# Patient Record
Sex: Female | Born: 1950 | Race: White | Hispanic: No | Smoking: Current every day smoker
Health system: Southern US, Community
[De-identification: ages and names within clinical notes are randomized; demographics above are authoritative.]

## PROBLEM LIST (undated history)

## (undated) DIAGNOSIS — J189 Pneumonia, unspecified organism: Secondary | ICD-10-CM

## (undated) DIAGNOSIS — J40 Bronchitis, not specified as acute or chronic: Secondary | ICD-10-CM

---

## 2000-10-03 ENCOUNTER — Emergency Department (HOSPITAL_COMMUNITY): Admission: EM | Admit: 2000-10-03 | Discharge: 2000-10-04 | Payer: Self-pay | Admitting: Emergency Medicine

## 2000-10-04 ENCOUNTER — Encounter: Payer: Self-pay | Admitting: Emergency Medicine

## 2002-07-21 ENCOUNTER — Emergency Department (HOSPITAL_COMMUNITY): Admission: EM | Admit: 2002-07-21 | Discharge: 2002-07-22 | Payer: Self-pay | Admitting: Emergency Medicine

## 2002-07-22 ENCOUNTER — Encounter: Payer: Self-pay | Admitting: Emergency Medicine

## 2002-11-06 ENCOUNTER — Emergency Department (HOSPITAL_COMMUNITY): Admission: EM | Admit: 2002-11-06 | Discharge: 2002-11-06 | Payer: Self-pay | Admitting: Emergency Medicine

## 2003-02-22 ENCOUNTER — Emergency Department (HOSPITAL_COMMUNITY): Admission: EM | Admit: 2003-02-22 | Discharge: 2003-02-22 | Payer: Self-pay | Admitting: Emergency Medicine

## 2005-08-17 ENCOUNTER — Emergency Department (HOSPITAL_COMMUNITY): Admission: EM | Admit: 2005-08-17 | Discharge: 2005-08-17 | Payer: Self-pay | Admitting: Emergency Medicine

## 2006-06-20 ENCOUNTER — Emergency Department (HOSPITAL_COMMUNITY): Admission: EM | Admit: 2006-06-20 | Discharge: 2006-06-20 | Payer: Self-pay | Admitting: Emergency Medicine

## 2009-04-04 ENCOUNTER — Emergency Department (HOSPITAL_COMMUNITY): Admission: EM | Admit: 2009-04-04 | Discharge: 2009-04-04 | Payer: Self-pay | Admitting: Emergency Medicine

## 2020-09-07 ENCOUNTER — Emergency Department (HOSPITAL_BASED_OUTPATIENT_CLINIC_OR_DEPARTMENT_OTHER)
Admission: EM | Admit: 2020-09-07 | Discharge: 2020-09-07 | Disposition: A | Discharge status: Home / Self Care | Payer: Medicare (Managed Care) | Attending: Emergency Medicine | Admitting: Emergency Medicine

## 2020-09-07 ENCOUNTER — Other Ambulatory Visit: Payer: Self-pay

## 2020-09-07 ENCOUNTER — Encounter (HOSPITAL_BASED_OUTPATIENT_CLINIC_OR_DEPARTMENT_OTHER): Payer: Self-pay | Admitting: Obstetrics and Gynecology

## 2020-09-07 DIAGNOSIS — F17210 Nicotine dependence, cigarettes, uncomplicated: Secondary | ICD-10-CM | POA: Insufficient documentation

## 2020-09-07 DIAGNOSIS — R059 Cough, unspecified: Secondary | ICD-10-CM | POA: Diagnosis present

## 2020-09-07 DIAGNOSIS — I10 Essential (primary) hypertension: Secondary | ICD-10-CM | POA: Diagnosis not present

## 2020-09-07 DIAGNOSIS — J209 Acute bronchitis, unspecified: Secondary | ICD-10-CM | POA: Diagnosis not present

## 2020-09-07 HISTORY — DX: Bronchitis, not specified as acute or chronic: J40

## 2020-09-07 HISTORY — DX: Pneumonia, unspecified organism: J18.9

## 2020-09-07 MED ORDER — ALBUTEROL SULFATE HFA 108 (90 BASE) MCG/ACT IN AERS
1.0000 | INHALATION_SPRAY | Freq: Four times a day (QID) | RESPIRATORY_TRACT | Status: DC | PRN
Start: 2020-09-07 — End: 2020-09-08
  Filled 2020-09-07: qty 6.7

## 2020-09-07 MED ORDER — IPRATROPIUM-ALBUTEROL 0.5-2.5 (3) MG/3ML IN SOLN
RESPIRATORY_TRACT | Status: AC
  Filled 2020-09-07: qty 3

## 2020-09-07 MED ORDER — IPRATROPIUM-ALBUTEROL 0.5-2.5 (3) MG/3ML IN SOLN
3.0000 mL | Freq: Once | RESPIRATORY_TRACT | Status: AC
  Administered 2020-09-07: 3 mL via RESPIRATORY_TRACT

## 2020-09-07 MED ORDER — PREDNISONE 50 MG PO TABS: 50.0000 mg | ORAL_TABLET | Freq: Every day | ORAL | 0 refills | Status: DC

## 2020-09-07 MED ORDER — PREDNISONE 50 MG PO TABS
60.0000 mg | ORAL_TABLET | Freq: Once | ORAL | Status: AC
  Administered 2020-09-07: 60 mg via ORAL
  Filled 2020-09-07: qty 1

## 2020-09-07 MED ORDER — AEROCHAMBER PLUS FLO-VU MEDIUM MISC
1.0000 | Freq: Once | Status: AC
  Administered 2020-09-07: 1
  Filled 2020-09-07: qty 1

## 2020-09-07 NOTE — ED Triage Notes (Signed)
Patient reports she thinks she has bronchitis. Patient reports she has had some minimal coughing. Patient reports she is a smoker and she is concerned she may have bronchitis or the flu Patient defers EKG and chest x-ray at this time.

## 2020-09-07 NOTE — ED Provider Notes (Signed)
[facility] EMERGENCY DEPT Provider Note   CSN: [id number] Arrival date & time: 09/07/20  2009     History Chief Complaint  Patient presents with  . URI    Paula Berger is a 70 y.o. female.  HPI   Patient presented to the ED for evaluation of cough and congestion for the last few days.  Patient states she does smoke and has had issues with bronchitis in the past.  Normally she does not take any medications.  Patient denies any fevers or chills.  No chest pain.  She just has felt some congestion in her chest.  She denies any vomiting or diarrhea.  Patient states she tried to go to one of the urgent cares but they closed.  She figured she would come here to get checked out.  Past Medical History:  Diagnosis Date  . Bronchitis   . Pneumonia     There are no problems to display for this patient.   History reviewed. No pertinent surgical history.   OB History   No obstetric history on file.     No family history on file.  Social History   Tobacco Use  . Smoking status: Current Every Day Smoker    Types: Cigarettes  . Smokeless tobacco: Never Used  Vaping Use  . Vaping Use: Never used  Substance Use Topics  . Alcohol use: Not Currently  . Drug use: Not Currently    Home Medications Prior to Admission medications   Not on File    Allergies    Patient has no allergy information on record.  Review of Systems   Review of Systems  All other systems reviewed and are negative.   Physical Exam Updated Vital Signs BP (!) 169/100 (BP Location: Right Arm)   Pulse 72   Temp 98.3 F (36.8 C) (Oral)   Resp 18   Ht 1.626 m (5\' 4" )   Wt 85.3 kg   SpO2 98%   BMI 32.27 kg/m   Physical Exam Vitals and nursing note reviewed.  Constitutional:      General: She is not in acute distress.    Appearance: She is well-developed.  HENT:     Head: Normocephalic and atraumatic.     Right Ear: External ear normal.     Left Ear: External ear normal.  Eyes:      General: No scleral icterus.       Right eye: No discharge.        Left eye: No discharge.     Conjunctiva/sclera: Conjunctivae normal.  Neck:     Trachea: No tracheal deviation.  Cardiovascular:     Rate and Rhythm: Normal rate and regular rhythm.  Pulmonary:     Effort: Pulmonary effort is normal. No respiratory distress.     Breath sounds: No stridor. Wheezing present. No rales.  Abdominal:     General: Bowel sounds are normal. There is no distension.     Palpations: Abdomen is soft.     Tenderness: There is no abdominal tenderness. There is no guarding or rebound.  Musculoskeletal:        General: No tenderness.     Cervical back: Neck supple.  Skin:    General: Skin is warm and dry.     Findings: No rash.  Neurological:     Mental Status: She is alert.     Cranial Nerves: No cranial nerve deficit (no facial droop, extraocular movements intact, no slurred speech).  Sensory: No sensory deficit.     Motor: No abnormal muscle tone or seizure activity.     Coordination: Coordination normal.     ED Results / Procedures / Treatments   Labs (all labs ordered are listed, but only abnormal results are displayed) Labs Reviewed - No data to display  EKG None  Radiology No results found.  Procedures Procedures   Medications Ordered in ED Medications  predniSONE (DELTASONE) tablet 60 mg (has no administration in time range)  albuterol (VENTOLIN HFA) 108 (90 Base) MCG/ACT inhaler 1-2 puff (has no administration in time range)  ipratropium-albuterol (DUONEB) 0.5-2.5 (3) MG/3ML nebulizer solution (has no administration in time range)  ipratropium-albuterol (DUONEB) 0.5-2.5 (3) MG/3ML nebulizer solution 3 mL (3 mLs Nebulization Given 09/07/20 2048)    ED Course  I have reviewed the triage vital signs and the nursing notes.  Pertinent labs & imaging results that were available during my care of the patient were reviewed by me and considered in my medical decision making  (see chart for details).    MDM Rules/Calculators/A&P                          Patient has some slight wheezing on exam.  She is a smoker and I suspect she is having bronchitis with bronchospasm.  Discussed doing a chest x-ray to make sure there is no evidence of pneumonia.  Patient states she would prefer to just try taking the medications and if she does not get better then proceed with a chest x-ray.  Patient also noted to have hypertension.  I recommend outpatient follow-up. Final Clinical Impression(s) / ED Diagnoses Final diagnoses:  Acute bronchitis with bronchospasm  Hypertension, unspecified type    Rx / DC Orders ED Discharge Orders    None       Linwood Dibbles, MD 09/07/20 2051

## 2020-09-07 NOTE — Discharge Instructions (Addendum)
Take the steroids as prescribed.  Use the inhaler to help with the coughing and the wheezing.  Follow-up with a primary care doctor to be rechecked.  Return to the ED for chest pain worsening breathing fevers or chills  Your blood pressure was also elevated today.  Follow-up with your primary care doctor when you are feeling better to have that rechecked

## 2020-09-07 NOTE — Progress Notes (Signed)
RT educated pt on proper use of aero chamber w/MDI medication. Pt able to teach back to RT proper use of chamber. Pt also educated on smoking cessation to help w/her breathing. Discussed different ways of stopping smoking and the importance to her health. Pt expressed understanding.

## 2020-09-07 NOTE — Progress Notes (Signed)
Pt stated she was feeling worse after being outside w/no air conditioning. RT educated pt on importance of avoiding going out during the hottest part of the day and to try and go out when it is cooler and less humid. Pt states she believes it is related to seasonal allergies.

## 2020-09-13 ENCOUNTER — Emergency Department (HOSPITAL_BASED_OUTPATIENT_CLINIC_OR_DEPARTMENT_OTHER): Payer: Medicare (Managed Care)

## 2020-09-13 ENCOUNTER — Encounter (HOSPITAL_BASED_OUTPATIENT_CLINIC_OR_DEPARTMENT_OTHER): Payer: Self-pay | Admitting: *Deleted

## 2020-09-13 ENCOUNTER — Emergency Department (HOSPITAL_BASED_OUTPATIENT_CLINIC_OR_DEPARTMENT_OTHER)
Admission: EM | Admit: 2020-09-13 | Discharge: 2020-09-13 | Disposition: A | Discharge status: Home / Self Care | Payer: Medicare (Managed Care) | Attending: Emergency Medicine | Admitting: Emergency Medicine

## 2020-09-13 ENCOUNTER — Other Ambulatory Visit: Payer: Self-pay

## 2020-09-13 DIAGNOSIS — F17210 Nicotine dependence, cigarettes, uncomplicated: Secondary | ICD-10-CM | POA: Diagnosis not present

## 2020-09-13 DIAGNOSIS — R0981 Nasal congestion: Secondary | ICD-10-CM | POA: Diagnosis not present

## 2020-09-13 DIAGNOSIS — R0789 Other chest pain: Secondary | ICD-10-CM | POA: Insufficient documentation

## 2020-09-13 DIAGNOSIS — R079 Chest pain, unspecified: Secondary | ICD-10-CM | POA: Diagnosis present

## 2020-09-13 DIAGNOSIS — R062 Wheezing: Secondary | ICD-10-CM | POA: Diagnosis not present

## 2020-09-13 DIAGNOSIS — Z20822 Contact with and (suspected) exposure to covid-19: Secondary | ICD-10-CM | POA: Insufficient documentation

## 2020-09-13 DIAGNOSIS — R059 Cough, unspecified: Secondary | ICD-10-CM | POA: Diagnosis not present

## 2020-09-13 LAB — COMPREHENSIVE METABOLIC PANEL
ALT: 30 U/L (ref 0–44)
AST: 33 U/L (ref 15–41)
Albumin: 3.6 g/dL (ref 3.5–5.0)
Alkaline Phosphatase: 72 U/L (ref 38–126)
Anion gap: 9 (ref 5–15)
BUN: 14 mg/dL (ref 8–23)
CO2: 27 mmol/L (ref 22–32)
Calcium: 8.9 mg/dL (ref 8.9–10.3)
Chloride: 103 mmol/L (ref 98–111)
Creatinine, Ser: 0.64 mg/dL (ref 0.44–1.00)
GFR, Estimated: >60 mL/min (ref >60)
Glucose, Bld: 90 mg/dL (ref 70–99)
Potassium: 3.8 mmol/L (ref 3.5–5.1)
Sodium: 139 mmol/L (ref 135–145)
Total Bilirubin: 0.4 mg/dL (ref 0.3–1.2)
Total Protein: 6.8 g/dL (ref 6.5–8.1)

## 2020-09-13 LAB — RESP PANEL BY RT-PCR (FLU A&B, COVID) ARPGX2
Influenza A by PCR: NEGATIVE
Influenza B by PCR: NEGATIVE
SARS Coronavirus 2 by RT PCR: NEGATIVE

## 2020-09-13 LAB — CBC
HCT: 45.5 % (ref 36.0–46.0)
Hemoglobin: 15.2 g/dL — ABNORMAL HIGH (ref 12.0–15.0)
MCH: 30.1 pg (ref 26.0–34.0)
MCHC: 33.4 g/dL (ref 30.0–36.0)
MCV: 90.1 fL (ref 80.0–100.0)
Platelets: 334 10*3/uL (ref 150–400)
RBC: 5.05 MIL/uL (ref 3.87–5.11)
RDW: 13.5 % (ref 11.5–15.5)
WBC: 14.4 10*3/uL — ABNORMAL HIGH (ref 4.0–10.5)
nRBC: 0 % (ref 0.0–0.2)

## 2020-09-13 LAB — TROPONIN I (HIGH SENSITIVITY): Troponin I (High Sensitivity): 6 ng/L (ref <18)

## 2020-09-13 MED ORDER — ALBUTEROL SULFATE HFA 108 (90 BASE) MCG/ACT IN AERS
4.0000 | INHALATION_SPRAY | Freq: Once | RESPIRATORY_TRACT | Status: AC
  Administered 2020-09-13: 4 via RESPIRATORY_TRACT
  Filled 2020-09-13: qty 6.7

## 2020-09-13 MED ORDER — PREDNISONE 10 MG PO TABS: 40.0000 mg | ORAL_TABLET | Freq: Every day | ORAL | 0 refills | Status: AC

## 2020-09-13 NOTE — ED Triage Notes (Signed)
Pt was here this past Thursday for bronchitis and is here for complaint of chest and back cramps since Thursday.

## 2020-09-13 NOTE — ED Notes (Signed)
States was here recently for Bronchitis, returns today stating she does not feel better and having cramp like pain intermittently all across here chest area to LUQ of abd

## 2020-09-13 NOTE — ED Notes (Signed)
AVS provided to client, discussed dx by ED MD , also informed client of Rx sent to her pharmacy listed on the EMR. Opportunity for questions provided prior to DC.

## 2020-09-13 NOTE — ED Notes (Signed)
Covid Swab obtained 

## 2020-09-13 NOTE — ED Provider Notes (Signed)
[facility] EMERGENCY DEPT Provider Note   CSN: [id number] Arrival date & time: 09/13/20  1049     History Chief Complaint  Patient presents with  . Chest Pain    Paula Berger is a 70 y.o. female.  HPI      70 year old female who was recently seen June 2 with concern for cough, and congestion diagnosed with bronchitis, returns with concern for cramping chest pain.  Reports she has been having this cramping chest pain on and off over the last 2 weeks, however it is worsened since she had been seen here on Thursday.  Describes it as a cramp across the front of her chest.  Reports she does feel some cramping as well in her back.  Denies radiation to her neck, arms.  Denies associated shortness of breath, nausea, diaphoresis.  Reports that she has had a chronic cough related to her smoking that does seem to be worse.  The pain is not worsened with exertion, position, and it is not pleuritic.  She does not have any shortness of breath.  Denies leg pain or swelling.  Denies fevers.  Reports she does have chronic cough, but it does seem worse.  She does not have a primary care doctor, and has been healthy.  Pain is consistently waxing and waning.  Past Medical History:  Diagnosis Date  . Bronchitis   . Pneumonia     There are no problems to display for this patient.   History reviewed. No pertinent surgical history.   OB History    Gravida  3   Para      Term      Preterm      AB  2   Living        SAB      IAB      Ectopic      Multiple      Live Births              No family history on file.  Social History   Tobacco Use  . Smoking status: Current Every Day Smoker    Types: Cigarettes  . Smokeless tobacco: Never Used  Vaping Use  . Vaping Use: Never used  Substance Use Topics  . Alcohol use: Not Currently  . Drug use: Not Currently    Home Medications Prior to Admission medications   Medication Sig Start Date End Date Taking?  Authorizing Provider  predniSONE (DELTASONE) 10 MG tablet Take 4 tablets (40 mg total) by mouth daily for 4 days. 09/13/20 09/17/20 Yes Alvira Monday, MD    Allergies    Patient has no allergy information on record.  Review of Systems   Review of Systems  Constitutional: Positive for fatigue. Negative for fever.  HENT: Negative for sore throat.   Eyes: Negative for visual disturbance.  Respiratory: Positive for cough. Negative for shortness of breath.   Cardiovascular: Positive for chest pain.  Gastrointestinal: Negative for abdominal pain, nausea and vomiting.  Genitourinary: Negative for difficulty urinating.  Musculoskeletal: Negative for back pain and neck pain.  Skin: Negative for rash.  Neurological: Negative for syncope and headaches.    Physical Exam Updated Vital Signs BP (!) 151/89 (BP Location: Right Arm)   Pulse 62   Temp 98.3 F (36.8 C) (Oral)   Resp 18   Ht 5\' 4"  (1.626 m)   Wt 83.9 kg   SpO2 96%   BMI 31.75 kg/m   Physical Exam Vitals and nursing  note reviewed.  Constitutional:      General: She is not in acute distress.    Appearance: She is well-developed. She is not diaphoretic.  HENT:     Head: Normocephalic and atraumatic.  Eyes:     Conjunctiva/sclera: Conjunctivae normal.  Cardiovascular:     Rate and Rhythm: Normal rate and regular rhythm.     Heart sounds: Normal heart sounds. No murmur heard. No friction rub. No gallop.   Pulmonary:     Effort: Pulmonary effort is normal. No respiratory distress.     Breath sounds: Wheezing present. No rales.  Chest:     Chest wall: Tenderness present.  Abdominal:     General: There is no distension.     Palpations: Abdomen is soft.     Tenderness: There is no abdominal tenderness. There is no guarding.  Musculoskeletal:        General: No tenderness.     Cervical back: Normal range of motion.  Skin:    General: Skin is warm and dry.     Findings: No erythema or rash.  Neurological:     Mental  Status: She is alert and oriented to person, place, and time.     ED Results / Procedures / Treatments   Labs (all labs ordered are listed, but only abnormal results are displayed) Labs Reviewed  CBC - Abnormal; Notable for the following components:      Result Value   WBC 14.4 (*)    Hemoglobin 15.2 (*)    All other components within normal limits  RESP PANEL BY RT-PCR (FLU A&B, COVID) ARPGX2  COMPREHENSIVE METABOLIC PANEL  TROPONIN I (HIGH SENSITIVITY)  TROPONIN I (HIGH SENSITIVITY)    EKG EKG Interpretation  Date/Time:  Wednesday September 13 2020 11:00:02 EDT Ventricular Rate:  70 PR Interval:  138 QRS Duration: 84 QT Interval:  372 QTC Calculation: 402 R Axis:   70 Text Interpretation: Sinus rhythm Atrial premature complex No significant change since last tracing Confirmed by Alvira Monday ([id number]) on 09/13/2020 11:29:37 AM   Radiology DG Chest Port 1 View  Result Date: 09/13/2020 CLINICAL DATA:  Chest pain. EXAM: PORTABLE CHEST 1 VIEW COMPARISON:  04/04/2009. FINDINGS: Mediastinum and hilar structures normal. Cardiomegaly. No pulmonary venous congestion. Low lung volumes. No focal alveolar infiltrate. No pleural effusion or pneumothorax. Degenerative change thoracic spine. Degenerative changes both shoulders. IMPRESSION: 1.  Cardiomegaly.  No pulmonary venous congestion. 2.  Low lung volumes.  No focal alveolar infiltrate. Electronically Signed   By: Maisie Fus  Register   On: 09/13/2020 12:58    Procedures Procedures   Medications Ordered in ED Medications  albuterol (VENTOLIN HFA) 108 (90 Base) MCG/ACT inhaler 4 puff (4 puffs Inhalation Given 09/13/20 1258)    ED Course  I have reviewed the triage vital signs and the nursing notes.  Pertinent labs & imaging results that were available during my care of the patient were reviewed by me and considered in my medical decision making (see chart for details).    MDM Rules/Calculators/A&P                           70 year old female who was recently seen June 2 with concern for cough, and congestion diagnosed with bronchitis, returns with concern for cramping chest pain.   Differential diagnosis for chest pain includes pulmonary embolus, dissection, pneumothorax, pneumonia, ACS, myocarditis, pericarditis.  EKG was done and evaluate by me and showed no acute ST changes  and no signs of pericarditis. Chest x-ray was done and evaluated by me and radiology and showed no sign of pneumonia or pneumothorax. She has no dyspnea and have low suspicion for PE.  Patient is low risk HEART score with negative troponin after CP over days.  Do not feel history or exam are consistent with aortic dissection, normal bilateral pulses, intermittent cramping pain over 2 weeks.  She does acknowledge some worsening cough, and has some wheezing on exam.  Given albuterol, and will restart prednisone for 4 days.  Fields possible that this represents musculoskeletal pain.  She does have risk factors for chest pain such as smoking, and I suspect she likely has underlying hypertension given blood pressures here, and does not see a primary care doctor for other health maintenance.  Recommend she see a PCP, provided number for cardiology for follow-up. Patient discharged in stable condition with understanding of reasons to return.       Final Clinical Impression(s) / ED Diagnoses Final diagnoses:  Chest pain, unspecified type    Rx / DC Orders ED Discharge Orders         Ordered    predniSONE (DELTASONE) 10 MG tablet  Daily        09/13/20 1410           Alvira Monday, MD 09/13/20 1417

## 2021-05-04 ENCOUNTER — Emergency Department (HOSPITAL_BASED_OUTPATIENT_CLINIC_OR_DEPARTMENT_OTHER): Payer: Medicare (Managed Care)

## 2021-05-04 ENCOUNTER — Other Ambulatory Visit (HOSPITAL_BASED_OUTPATIENT_CLINIC_OR_DEPARTMENT_OTHER): Payer: Self-pay

## 2021-05-04 ENCOUNTER — Emergency Department (HOSPITAL_BASED_OUTPATIENT_CLINIC_OR_DEPARTMENT_OTHER)
Admission: EM | Admit: 2021-05-04 | Discharge: 2021-05-04 | Discharge status: Against Medical Advice | Payer: Medicare (Managed Care) | Attending: Emergency Medicine | Admitting: Emergency Medicine

## 2021-05-04 ENCOUNTER — Other Ambulatory Visit: Payer: Self-pay

## 2021-05-04 ENCOUNTER — Encounter (HOSPITAL_BASED_OUTPATIENT_CLINIC_OR_DEPARTMENT_OTHER): Payer: Self-pay | Admitting: Emergency Medicine

## 2021-05-04 DIAGNOSIS — J441 Chronic obstructive pulmonary disease with (acute) exacerbation: Secondary | ICD-10-CM | POA: Insufficient documentation

## 2021-05-04 DIAGNOSIS — Z20822 Contact with and (suspected) exposure to covid-19: Secondary | ICD-10-CM | POA: Diagnosis not present

## 2021-05-04 DIAGNOSIS — Z87891 Personal history of nicotine dependence: Secondary | ICD-10-CM | POA: Diagnosis not present

## 2021-05-04 DIAGNOSIS — R059 Cough, unspecified: Secondary | ICD-10-CM | POA: Diagnosis present

## 2021-05-04 LAB — CBC WITH DIFFERENTIAL/PLATELET
Abs Immature Granulocytes: 0.02 10*3/uL (ref 0.00–0.07)
Basophils Absolute: 0.1 10*3/uL (ref 0.0–0.1)
Basophils Relative: 1 %
Eosinophils Absolute: 0.6 10*3/uL — ABNORMAL HIGH (ref 0.0–0.5)
Eosinophils Relative: 6 %
HCT: 46.4 % — ABNORMAL HIGH (ref 36.0–46.0)
Hemoglobin: 15.3 g/dL — ABNORMAL HIGH (ref 12.0–15.0)
Immature Granulocytes: 0 %
Lymphocytes Relative: 22 %
Lymphs Abs: 2.2 10*3/uL (ref 0.7–4.0)
MCH: 29.4 pg (ref 26.0–34.0)
MCHC: 33 g/dL (ref 30.0–36.0)
MCV: 89.2 fL (ref 80.0–100.0)
Monocytes Absolute: 0.7 10*3/uL (ref 0.1–1.0)
Monocytes Relative: 7 %
Neutro Abs: 6.3 10*3/uL (ref 1.7–7.7)
Neutrophils Relative %: 64 %
Platelets: 312 10*3/uL (ref 150–400)
RBC: 5.2 MIL/uL — ABNORMAL HIGH (ref 3.87–5.11)
RDW: 13.5 % (ref 11.5–15.5)
WBC: 9.9 10*3/uL (ref 4.0–10.5)
nRBC: 0 % (ref 0.0–0.2)

## 2021-05-04 LAB — BASIC METABOLIC PANEL
Anion gap: 9 (ref 5–15)
BUN: 12 mg/dL (ref 8–23)
CO2: 26 mmol/L (ref 22–32)
Calcium: 9.6 mg/dL (ref 8.9–10.3)
Chloride: 103 mmol/L (ref 98–111)
Creatinine, Ser: 0.65 mg/dL (ref 0.44–1.00)
GFR, Estimated: >60 mL/min (ref >60)
Glucose, Bld: 97 mg/dL (ref 70–99)
Potassium: 4.1 mmol/L (ref 3.5–5.1)
Sodium: 138 mmol/L (ref 135–145)

## 2021-05-04 LAB — RESP PANEL BY RT-PCR (FLU A&B, COVID) ARPGX2
Influenza A by PCR: NEGATIVE
Influenza B by PCR: NEGATIVE
SARS Coronavirus 2 by RT PCR: NEGATIVE

## 2021-05-04 LAB — BRAIN NATRIURETIC PEPTIDE: B Natriuretic Peptide: 97.7 pg/mL (ref 0.0–100.0)

## 2021-05-04 MED ORDER — ALBUTEROL SULFATE (2.5 MG/3ML) 0.083% IN NEBU
10.0000 mg/h | INHALATION_SOLUTION | RESPIRATORY_TRACT | Status: DC
  Administered 2021-05-04: 10 mg/h via RESPIRATORY_TRACT
  Filled 2021-05-04: qty 12

## 2021-05-04 MED ORDER — PREDNISONE 20 MG PO TABS
20.0000 mg | ORAL_TABLET | Freq: Two times a day (BID) | ORAL | 0 refills | Status: AC
  Filled 2021-05-04: qty 10, 5d supply, fill #0

## 2021-05-04 MED ORDER — AZITHROMYCIN 250 MG PO TABS
250.0000 mg | ORAL_TABLET | Freq: Every day | ORAL | 0 refills | Status: DC
  Filled 2021-05-04: qty 6, 5d supply, fill #0

## 2021-05-04 MED ORDER — IPRATROPIUM-ALBUTEROL 0.5-2.5 (3) MG/3ML IN SOLN
3.0000 mL | Freq: Once | RESPIRATORY_TRACT | Status: AC
  Administered 2021-05-04: 3 mL via RESPIRATORY_TRACT
  Filled 2021-05-04: qty 3

## 2021-05-04 MED ORDER — METHYLPREDNISOLONE SODIUM SUCC 125 MG IJ SOLR
125.0000 mg | Freq: Once | INTRAMUSCULAR | Status: AC
  Administered 2021-05-04: 125 mg via INTRAVENOUS
  Filled 2021-05-04: qty 2

## 2021-05-04 NOTE — Discharge Instructions (Signed)
Return at any time if you change your mind or if your condition feels worse.  Follow-up with your doctor in 1 or 2 days.  Take the medications as prescribed.

## 2021-05-04 NOTE — ED Notes (Signed)
RT Note: Pts. Oxygen sats levelled @ 92% on room air after Duoneb aerosol with MD notified.

## 2021-05-04 NOTE — ED Provider Notes (Signed)
[facility] EMERGENCY DEPT Provider Note   CSN: [id number] Arrival date & time: 05/04/21  6270     History  Chief Complaint  Patient presents with   Cough    Paula Berger is a 71 y.o. female.  Patient with extensive smoking history, presents with chief complaint of difficulty breathing.  Symptoms ongoing for the past 3 to 4 days.  Denies any chest pain but she has had a cough.  No reports of fevers or vomiting or diarrhea.  Denies any pain.      Home Medications Prior to Admission medications   Medication Sig Start Date End Date Taking? Authorizing Provider  azithromycin (ZITHROMAX) 250 MG tablet Take 1 tablet (250 mg total) by mouth daily. Take first 2 tablets together, then 1 every day until finished. 05/04/21  Yes Kishana Battey, Eustace Moore, MD  predniSONE (DELTASONE) 20 MG tablet Take 1 tablet (20 mg total) by mouth 2 (two) times daily with a meal for 5 days. 05/04/21 05/09/21 Yes Cheryll Cockayne, MD      Allergies    Patient has no known allergies.    Review of Systems   Review of Systems  Constitutional:  Negative for fever.  HENT:  Negative for ear pain.   Eyes:  Negative for pain.  Respiratory:  Positive for cough and shortness of breath.   Cardiovascular:  Negative for chest pain.  Gastrointestinal:  Negative for abdominal pain.  Genitourinary:  Negative for flank pain.  Musculoskeletal:  Negative for back pain.  Skin:  Negative for rash.  Neurological:  Negative for headaches.   Physical Exam Updated Vital Signs BP 128/85    Pulse 97    Temp 98.4 F (36.9 C) (Oral)    Resp (!) 23    Ht 5\' 4"  (1.626 m)    Wt 85.7 kg    SpO2 90%    BMI 32.44 kg/m  Physical Exam Constitutional:      General: She is not in acute distress.    Appearance: Normal appearance.  HENT:     Head: Normocephalic.     Nose: Nose normal.  Eyes:     Extraocular Movements: Extraocular movements intact.  Cardiovascular:     Rate and Rhythm: Tachycardia present.  Pulmonary:     Breath  sounds: Wheezing present.  Musculoskeletal:        General: Normal range of motion.     Cervical back: Normal range of motion.  Neurological:     General: No focal deficit present.     Mental Status: She is alert. Mental status is at baseline.    ED Results / Procedures / Treatments   Labs (all labs ordered are listed, but only abnormal results are displayed) Labs Reviewed  CBC WITH DIFFERENTIAL/PLATELET - Abnormal; Notable for the following components:      Result Value   RBC 5.20 (*)    Hemoglobin 15.3 (*)    HCT 46.4 (*)    Eosinophils Absolute 0.6 (*)    All other components within normal limits  RESP PANEL BY RT-PCR (FLU A&B, COVID) ARPGX2  BASIC METABOLIC PANEL  BRAIN NATRIURETIC PEPTIDE    EKG None  Radiology DG Chest 1 View  Result Date: 05/04/2021 CLINICAL DATA:  A 71 year old female presents for evaluation of cough for 3 days also with apparent shortness of breath. EXAM: CHEST  1 VIEW COMPARISON:  April 04, 2009 and September 13, 2020. FINDINGS: EKG leads project over the chest. Cardiomediastinal contours and hilar structures are  stable and normal. Lungs are clear. No visible pneumothorax. On limited assessment there is no acute skeletal process. IMPRESSION: No acute cardiopulmonary disease. Electronically Signed   By: Donzetta Kohut M.D.   On: 05/04/2021 09:01    Procedures .Critical Care Performed by: Cheryll Cockayne, MD Authorized by: Cheryll Cockayne, MD   Critical care provider statement:    Critical care time (minutes):  40   Critical care time was exclusive of:  Separately billable procedures and treating other patients and teaching time   Critical care was necessary to treat or prevent imminent or life-threatening deterioration of the following conditions:  Respiratory failure    Medications Ordered in ED Medications  albuterol (PROVENTIL) (2.5 MG/3ML) 0.083% nebulizer solution (0 mg/hr Nebulization Stopped 05/04/21 1057)  ipratropium-albuterol (DUONEB)  0.5-2.5 (3) MG/3ML nebulizer solution 3 mL (3 mLs Nebulization Given 05/04/21 0848)  methylPREDNISolone sodium succinate (SOLU-MEDROL) 125 mg/2 mL injection 125 mg (125 mg Intravenous Given 05/04/21 0902)    ED Course/ Medical Decision Making/ A&P                           Medical Decision Making Amount and/or Complexity of Data Reviewed External Data Reviewed: notes.    Details: Patient seen in June 2022 for chest pain evaluation and discharged. Labs: ordered. Radiology: ordered and independent interpretation performed. ECG/medicine tests: ordered and independent interpretation performed. Decision-making details documented in ED Course.  Risk Prescription drug management. Decision regarding hospitalization. Risk Details: Patient presents with diffuse wheezes smoking history.  Consistent with COPD exacerbation.  Typically she is about 95 to 100% on room air based on prior visit history.  However today she hovers around 88% to 93%.  Patient given multiple breathing treatments and continuous albuterol with some improvement of wheezes but still satting around 89 to 91% on room air.  Recommended admission to the hospital for further treatment, however the patient says she has many things to do at home many affairs to get arranged and declines admission.  Risks and benefits discussed all questions answered patient has decision-making capacity and will be leaving AGAINST MEDICAL ADVICE.  I encouraged her to return anytime if she changes her mind or feels worse.  Advised her to follow-up with a primary care physician within 1 or 2 days.    Critical Care Total time providing critical care: 30-74 minutes          Final Clinical Impression(s) / ED Diagnoses Final diagnoses:  COPD exacerbation (HCC)    Rx / DC Orders ED Discharge Orders          Ordered    azithromycin (ZITHROMAX) 250 MG tablet  Daily        05/04/21 1150    predniSONE (DELTASONE) 20 MG tablet  2 times daily with  meals        05/04/21 1150              Cheryll Cockayne, MD 05/04/21 1150

## 2021-05-04 NOTE — ED Notes (Signed)
RT Note: Pt. completed (1) hour nebulizer(10)mg-Albuterol after initial DuoNeb aerosol, currently on room air and remains on monitor to trend Oxygen saturations determining admission status, RN/MD aware, pt. remains on monitor.

## 2021-05-04 NOTE — ED Triage Notes (Signed)
Cough x 3 days. Pt appears SOB.

## 2021-05-09 ENCOUNTER — Other Ambulatory Visit (HOSPITAL_BASED_OUTPATIENT_CLINIC_OR_DEPARTMENT_OTHER): Payer: Self-pay

## 2021-09-05 ENCOUNTER — Other Ambulatory Visit (HOSPITAL_COMMUNITY): Payer: Self-pay

## 2022-07-19 IMAGING — DX DG CHEST 1V PORT
1 series · 1 of 1 positions shown · non-contrast
Comparison: 04/04/2009.

CLINICAL DATA: Chest pain.

EXAM:
PORTABLE CHEST 1 VIEW

[chest]
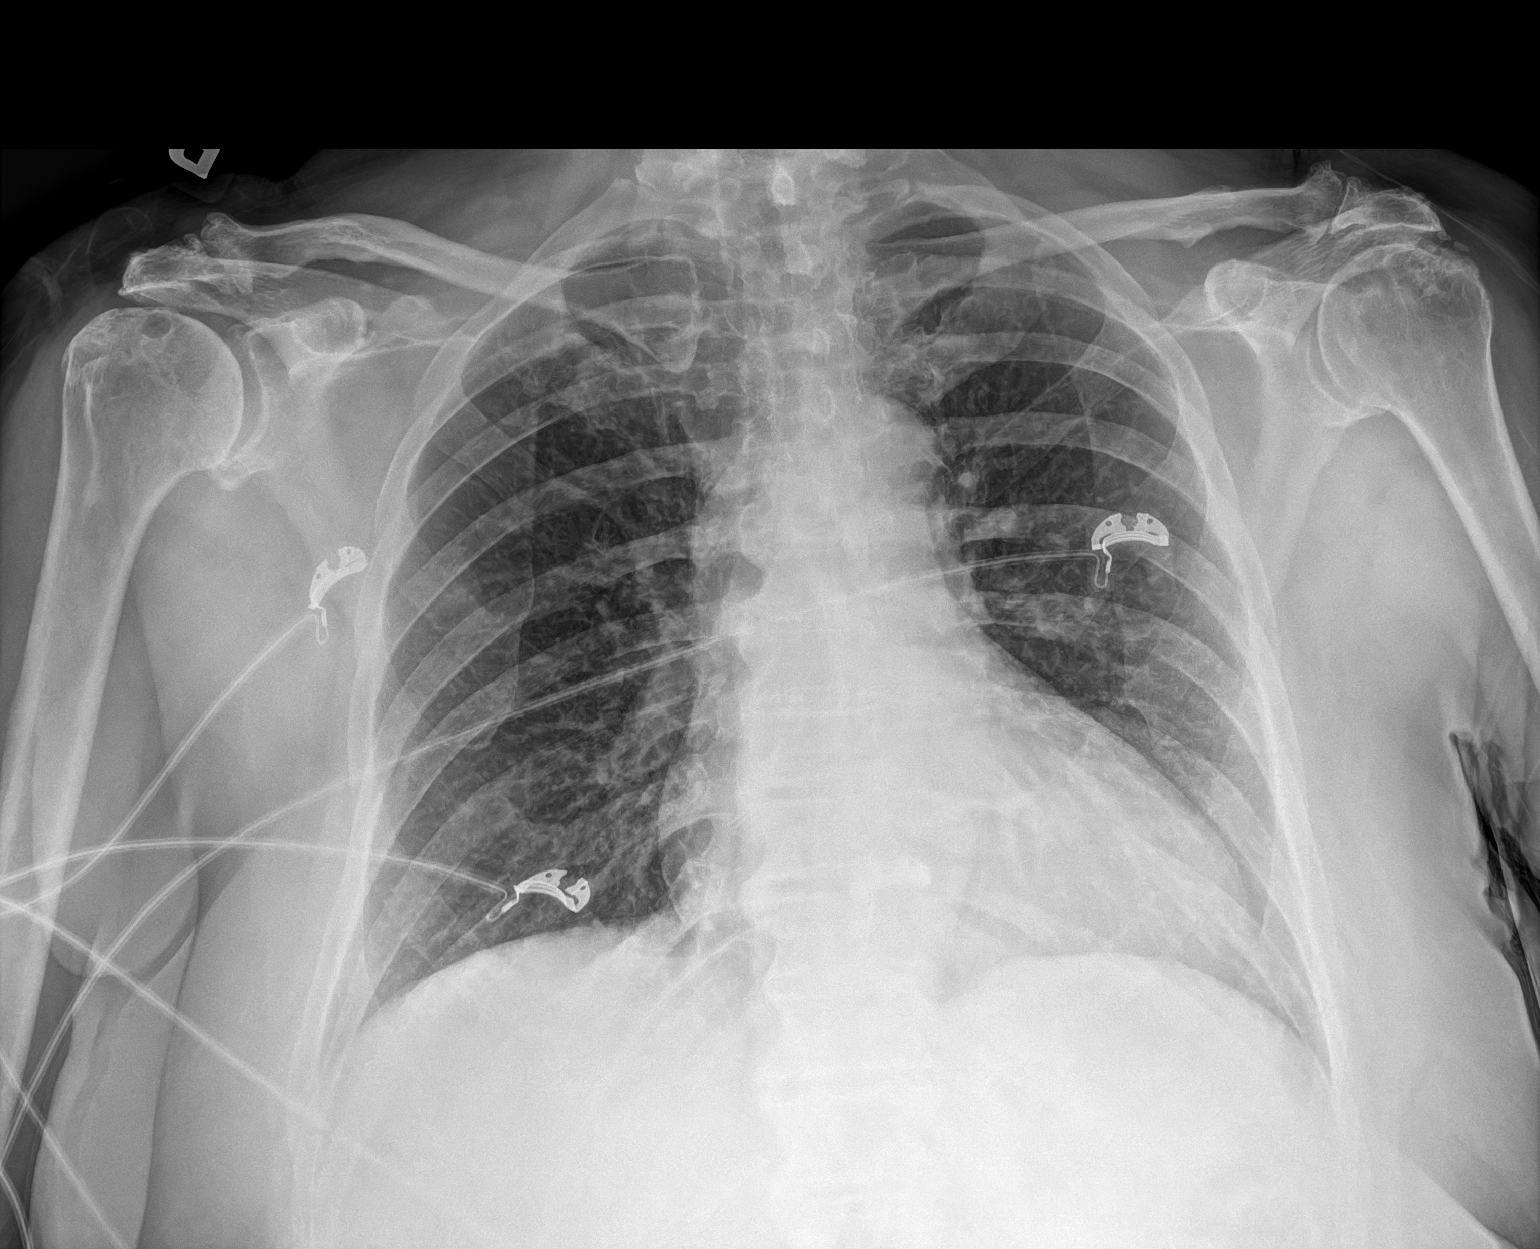

[1 of 1 positions shown; findings below may reference images not displayed]

FINDINGS: Mediastinum and hilar structures normal. Cardiomegaly. No pulmonary
venous congestion. Low lung volumes. No focal alveolar infiltrate.
No pleural effusion or pneumothorax. Degenerative change thoracic
spine. Degenerative changes both shoulders.
IMPRESSION: 1.  Cardiomegaly.  No pulmonary venous congestion.

2.  Low lung volumes.  No focal alveolar infiltrate.

## 2023-01-11 ENCOUNTER — Emergency Department (HOSPITAL_BASED_OUTPATIENT_CLINIC_OR_DEPARTMENT_OTHER)
Admission: EM | Admit: 2023-01-11 | Discharge: 2023-01-11 | Discharge status: Against Medical Advice | Payer: Medicare (Managed Care)

## 2023-01-11 ENCOUNTER — Other Ambulatory Visit: Payer: Self-pay

## 2023-01-11 ENCOUNTER — Emergency Department (HOSPITAL_BASED_OUTPATIENT_CLINIC_OR_DEPARTMENT_OTHER): Payer: Medicare (Managed Care) | Admitting: Radiology

## 2023-01-11 ENCOUNTER — Encounter (HOSPITAL_BASED_OUTPATIENT_CLINIC_OR_DEPARTMENT_OTHER): Payer: Self-pay

## 2023-01-11 DIAGNOSIS — Z1152 Encounter for screening for COVID-19: Secondary | ICD-10-CM | POA: Diagnosis not present

## 2023-01-11 DIAGNOSIS — J441 Chronic obstructive pulmonary disease with (acute) exacerbation: Secondary | ICD-10-CM | POA: Insufficient documentation

## 2023-01-11 DIAGNOSIS — Z5329 Procedure and treatment not carried out because of patient's decision for other reasons: Secondary | ICD-10-CM | POA: Insufficient documentation

## 2023-01-11 DIAGNOSIS — R059 Cough, unspecified: Secondary | ICD-10-CM | POA: Diagnosis present

## 2023-01-11 LAB — RESP PANEL BY RT-PCR (RSV, FLU A&B, COVID)  RVPGX2
Influenza A by PCR: NEGATIVE
Influenza B by PCR: NEGATIVE
Resp Syncytial Virus by PCR: NEGATIVE
SARS Coronavirus 2 by RT PCR: NEGATIVE

## 2023-01-11 MED ORDER — IPRATROPIUM-ALBUTEROL 0.5-2.5 (3) MG/3ML IN SOLN
3.0000 mL | Freq: Once | RESPIRATORY_TRACT | Status: AC
  Administered 2023-01-11: 3 mL via RESPIRATORY_TRACT
  Filled 2023-01-11: qty 3

## 2023-01-11 MED ORDER — METHYLPREDNISOLONE SODIUM SUCC 125 MG IJ SOLR
125.0000 mg | Freq: Once | INTRAMUSCULAR | Status: AC
  Administered 2023-01-11: 125 mg via INTRAVENOUS
  Filled 2023-01-11: qty 2

## 2023-01-11 MED ORDER — MAGNESIUM SULFATE 50 % IJ SOLN: 2.0000 g | Freq: Once | INTRAMUSCULAR | Status: DC

## 2023-01-11 MED ORDER — ALBUTEROL SULFATE (2.5 MG/3ML) 0.083% IN NEBU
2.5000 mg | INHALATION_SOLUTION | Freq: Once | RESPIRATORY_TRACT | Status: AC
  Administered 2023-01-11: 2.5 mg via RESPIRATORY_TRACT
  Filled 2023-01-11: qty 3

## 2023-01-11 MED ORDER — BENZONATATE 100 MG PO CAPS: 100.0000 mg | ORAL_CAPSULE | Freq: Three times a day (TID) | ORAL | 0 refills | Status: DC

## 2023-01-11 MED ORDER — PREDNISONE 10 MG PO TABS: ORAL_TABLET | ORAL | 0 refills | Status: AC

## 2023-01-11 MED ORDER — IPRATROPIUM-ALBUTEROL 0.5-2.5 (3) MG/3ML IN SOLN
RESPIRATORY_TRACT | Status: AC
  Administered 2023-01-11: 3 mL via RESPIRATORY_TRACT
  Filled 2023-01-11: qty 3

## 2023-01-11 MED ORDER — AEROCHAMBER PLUS FLO-VU LARGE MISC
2.0000 | Freq: Once | Status: AC
  Administered 2023-01-11: 2
  Filled 2023-01-11: qty 2

## 2023-01-11 MED ORDER — ALBUTEROL SULFATE (2.5 MG/3ML) 0.083% IN NEBU: 2.5000 mg | INHALATION_SOLUTION | Freq: Once | RESPIRATORY_TRACT | Status: AC

## 2023-01-11 MED ORDER — IPRATROPIUM-ALBUTEROL 0.5-2.5 (3) MG/3ML IN SOLN: 3.0000 mL | Freq: Once | RESPIRATORY_TRACT | Status: AC

## 2023-01-11 MED ORDER — ALBUTEROL SULFATE HFA 108 (90 BASE) MCG/ACT IN AERS
INHALATION_SPRAY | RESPIRATORY_TRACT | Status: AC
  Filled 2023-01-11: qty 6.7

## 2023-01-11 MED ORDER — MAGNESIUM SULFATE 2 GM/50ML IV SOLN
2.0000 g | Freq: Once | INTRAVENOUS | Status: AC
  Administered 2023-01-11: 2 g via INTRAVENOUS

## 2023-01-11 MED ORDER — ALBUTEROL SULFATE (2.5 MG/3ML) 0.083% IN NEBU
INHALATION_SOLUTION | RESPIRATORY_TRACT | Status: AC
  Administered 2023-01-11: 2.5 mg via RESPIRATORY_TRACT
  Filled 2023-01-11: qty 3

## 2023-01-11 NOTE — Discharge Instructions (Addendum)
You have been prescribed a prednisone taper at home.  This is a steroid medication.  Please take this medication first thing in the morning with breakfast as it can keep you up at night.  Please take in the taper format as prescribed. If you have any diabetes, please closely monitor your blood sugars as this medication can cause a rise in blood sugar.  You have been prescribed Tessalon Perles for your cough.   These medications have been sent over to your pharmacy   Return to the ER if you have any shortness of breath, difficulty breathing, chest pain, any other new or concerning symptoms.

## 2023-01-11 NOTE — ED Notes (Signed)
Pt still coughing/pursed lip breathing at times, tachypnea after coughing

## 2023-01-11 NOTE — ED Provider Notes (Signed)
[place] EMERGENCY DEPARTMENT AT [facility] Provider Note   CSN: [id number] Arrival date & time: 01/11/23  4098     History  Chief Complaint  Patient presents with   Cough    Paula Berger is a 72 y.o. female who smokes, who presents with concern for a productive cough that started 3 days ago and some associated shortness of breath.  Reports clear phlegm with the cough.  Denies any fever, chills, chest pain.   Cough      Home Medications Prior to Admission medications   Medication Sig Start Date End Date Taking? Authorizing Provider  benzonatate (TESSALON) 100 MG capsule Take 1 capsule (100 mg total) by mouth every 8 (eight) hours. 01/11/23  Yes Arabella Merles, PA-C  predniSONE (DELTASONE) 10 MG tablet Take 4 tablets (40 mg total) by mouth daily with breakfast for 3 days, THEN 3 tablets (30 mg total) daily with breakfast for 3 days, THEN 2 tablets (20 mg total) daily with breakfast for 2 days, THEN 1 tablet (10 mg total) daily with breakfast for 2 days. 01/11/23 01/21/23 Yes Arabella Merles, PA-C  azithromycin (ZITHROMAX) 250 MG tablet Take first 2 tablets together on day 1, then 1 tablet every day until finished. 05/04/21   Cheryll Cockayne, MD      Allergies    Patient has no known allergies.    Review of Systems   Review of Systems  Respiratory:  Positive for cough.     Physical Exam Updated Vital Signs BP 130/62   Pulse 98   Temp 98.4 F (36.9 C) (Oral)   Resp 20   SpO2 94%  Physical Exam Vitals and nursing note reviewed.  Constitutional:      Appearance: Normal appearance.     Comments: Heavy smoker voice   HENT:     Head: Atraumatic.  Cardiovascular:     Rate and Rhythm: Normal rate and regular rhythm.  Pulmonary:     Effort: Pulmonary effort is normal.     Comments: Diffuse wheezing in the lung fields bilaterally, rhonchi in the lower lobes bilaterally Musculoskeletal:     Comments: No right or left lower extremity edema  Neurological:      General: No focal deficit present.     Mental Status: She is alert.  Psychiatric:        Mood and Affect: Mood normal.        Behavior: Behavior normal.     ED Results / Procedures / Treatments   Labs (all labs ordered are listed, but only abnormal results are displayed) Labs Reviewed  RESP PANEL BY RT-PCR (RSV, FLU A&B, COVID)  [id number]    EKG None  Radiology DG Chest 2 View  Result Date: 01/11/2023 CLINICAL DATA:  Cough and shortness of breath for 3 days. EXAM: CHEST - 2 VIEW COMPARISON:  05/04/2021 FINDINGS: The heart size and mediastinal contours are within normal limits. Both lungs are clear. The visualized skeletal structures are unremarkable. IMPRESSION: No active cardiopulmonary disease. Electronically Signed   By: Danae Orleans M.D.   On: 01/11/2023 10:39    Procedures .Critical Care  Performed by: Arabella Merles, PA-C Authorized by: Arabella Merles, PA-C   Critical care provider statement:    Critical care time (minutes):  45   Critical care was necessary to treat or prevent imminent or life-threatening deterioration of the following conditions:  Respiratory failure   Critical care was time spent personally by me on the following activities:  Development of treatment  plan with patient or surrogate, discussions with consultants, evaluation of patient's response to treatment, examination of patient, ordering and review of laboratory studies, ordering and review of radiographic studies, ordering and performing treatments and interventions, pulse oximetry, re-evaluation of patient's condition, review of old charts and obtaining history from patient or surrogate Comments:     Patient shortness of breath and wheezing fail to improve after 4 DuoNebs, magnesium, steroid.  Required frequent reevaluation and involvement of respiratory therapist.  Thorough discussion with patient regarding wanting to admit and discussion of risk of leaving AMA      Medications Ordered in  ED Medications  ipratropium-albuterol (DUONEB) 0.5-2.5 (3) MG/3ML nebulizer solution 3 mL (3 mLs Nebulization Given 01/11/23 1110)  albuterol (PROVENTIL) (2.5 MG/3ML) 0.083% nebulizer solution 2.5 mg (2.5 mg Nebulization Given 01/11/23 1111)  albuterol (VENTOLIN HFA) 108 (90 Base) MCG/ACT inhaler (  Given 01/11/23 1111)  albuterol (PROVENTIL) (2.5 MG/3ML) 0.083% nebulizer solution 2.5 mg (2.5 mg Nebulization Given 01/11/23 1140)  ipratropium-albuterol (DUONEB) 0.5-2.5 (3) MG/3ML nebulizer solution 3 mL (3 mLs Nebulization Given 01/11/23 1141)  methylPREDNISolone sodium succinate (SOLU-MEDROL) 125 mg/2 mL injection 125 mg (125 mg Intravenous Given 01/11/23 1157)  albuterol (PROVENTIL) (2.5 MG/3ML) 0.083% nebulizer solution 2.5 mg (2.5 mg Nebulization Given 01/11/23 1216)  ipratropium-albuterol (DUONEB) 0.5-2.5 (3) MG/3ML nebulizer solution 3 mL (3 mLs Nebulization Given 01/11/23 1216)  magnesium sulfate IVPB 2 g 50 mL (0 g Intravenous Stopped 01/11/23 1214)  ipratropium-albuterol (DUONEB) 0.5-2.5 (3) MG/3ML nebulizer solution 3 mL (3 mLs Nebulization Given 01/11/23 1331)  AeroChamber Plus Flo-Vu Large MISC 2 each (2 each Other Given 01/11/23 1331)    ED Course/ Medical Decision Making/ A&P                                 Medical Decision Making Amount and/or Complexity of Data Reviewed Radiology: ordered.  Risk Prescription drug management.   72 y.o. female who smokes presents to the ED for concern of shortness of breath and cough for 3 days  Differential diagnosis includes but is not limited to COPD exacerbation, pneumonia, pleural effusion, heart failure, COVID, flu  ED Course:  Upon initial examination, patient is slightly tachypneic and has diffuse wheezing throughout lung fields.  No fever or chills, no findings of consolidation or effusions on chest x-ray, no concern for pneumonia at this time.  COVID, flu, RSV negative.  Suspect this is secondary to COPD exacerbation.  Patient was started  on continuous DuoNeb therapy.  She was evaluated after 2 DuoNeb treatments and still had diffuse wheezing.  Was started on 2 more DuoNeb treatments.  Also given 2 g IV magnesium and Solu-Medrol. Patient was reevaluated after the next 2 DuoNeb treatments and IV magnesium and Solu-Medrol.  Still with diffuse wheezing.  She is stating about 90% on room air, requiring 4 L nasal cannula to be brought up to 94%.  Does not use oxygen at home.   Had a thorough discussion with the patient that since she still has diffuse wheezing not improved with continuous DuoNeb treatments, steroids, magnesium here, and also with her oxygen requirement, recommended admission for further treatment and observation.  Patient declines at this time stating she has a dog to watch at home for her friend.  I discussed the risks of leaving AMA with the patient including possible worsening of symptoms, disability, death.  Patient has the ability to make medical decisions and verbalized understanding of these  risks and wishes to leave.   Impression: Shortness of breath  Disposition:  Patient left AMA.  I sent a prescription for prednisone taper and Tessalon Perles to her pharmacy.  Strict return precautions given.  Lab Tests: I Ordered, and personally interpreted labs.  The pertinent results include:   COVID, flu, RSV negative  Imaging Studies ordered: I ordered imaging studies including chest x-ray I independently visualized the imaging with scope of interpretation limited to determining acute life threatening conditions related to emergency care. Imaging showed no pleural effusion, consolidations, pneumothorax I agree with the radiologist interpretation   Cardiac Monitoring: / EKG: The patient was maintained on a cardiac monitor.  I personally viewed and interpreted the cardiac monitored which showed an underlying rhythm of: Patient rhythm changed between normal sinus rhythm and sinus tachycardia  External records from  outside source obtained and reviewed including ER visit for COPD exacerbation in 2022 where she also required DuoNeb and steroid therapy and left AMA   Co morbidities that complicate the patient evaluation  Current smoker  Social Determinants of Health:  impaired access to primary care             Final Clinical Impression(s) / ED Diagnoses Final diagnoses:  COPD exacerbation (HCC)    Rx / DC Orders ED Discharge Orders          Ordered    predniSONE (DELTASONE) 10 MG tablet  Q breakfast        01/11/23 1406    benzonatate (TESSALON) 100 MG capsule  Every 8 hours        01/11/23 1406              Arabella Merles, PA-C 01/11/23 1818    Coral Spikes, DO 01/12/23 [phone]

## 2023-01-11 NOTE — ED Triage Notes (Signed)
Patient presents with productive cough that started 3 days ago +associated shortness of breath. Denies chest pain, fever, chills

## 2023-01-31 ENCOUNTER — Emergency Department (HOSPITAL_BASED_OUTPATIENT_CLINIC_OR_DEPARTMENT_OTHER)
Admission: EM | Admit: 2023-01-31 | Discharge: 2023-01-31 | Disposition: A | Discharge status: Home / Self Care | Payer: Medicare (Managed Care) | Attending: Emergency Medicine | Admitting: Emergency Medicine

## 2023-01-31 ENCOUNTER — Encounter (HOSPITAL_BASED_OUTPATIENT_CLINIC_OR_DEPARTMENT_OTHER): Payer: Self-pay

## 2023-01-31 ENCOUNTER — Other Ambulatory Visit: Payer: Self-pay

## 2023-01-31 ENCOUNTER — Other Ambulatory Visit (HOSPITAL_BASED_OUTPATIENT_CLINIC_OR_DEPARTMENT_OTHER): Payer: Self-pay

## 2023-01-31 ENCOUNTER — Emergency Department (HOSPITAL_BASED_OUTPATIENT_CLINIC_OR_DEPARTMENT_OTHER): Payer: Medicare (Managed Care)

## 2023-01-31 DIAGNOSIS — J40 Bronchitis, not specified as acute or chronic: Secondary | ICD-10-CM | POA: Diagnosis not present

## 2023-01-31 DIAGNOSIS — R051 Acute cough: Secondary | ICD-10-CM

## 2023-01-31 DIAGNOSIS — R911 Solitary pulmonary nodule: Secondary | ICD-10-CM | POA: Diagnosis not present

## 2023-01-31 DIAGNOSIS — R0602 Shortness of breath: Secondary | ICD-10-CM | POA: Diagnosis present

## 2023-01-31 LAB — CBC WITH DIFFERENTIAL/PLATELET
Abs Immature Granulocytes: 0.01 10*3/uL (ref 0.00–0.07)
Basophils Absolute: 0.1 10*3/uL (ref 0.0–0.1)
Basophils Relative: 1 %
Eosinophils Absolute: 0.4 10*3/uL (ref 0.0–0.5)
Eosinophils Relative: 5 %
HCT: 41.4 % (ref 36.0–46.0)
Hemoglobin: 13.9 g/dL (ref 12.0–15.0)
Immature Granulocytes: 0 %
Lymphocytes Relative: 35 %
Lymphs Abs: 2.5 10*3/uL (ref 0.7–4.0)
MCH: 29.9 pg (ref 26.0–34.0)
MCHC: 33.6 g/dL (ref 30.0–36.0)
MCV: 89 fL (ref 80.0–100.0)
Monocytes Absolute: 0.5 10*3/uL (ref 0.1–1.0)
Monocytes Relative: 7 %
Neutro Abs: 3.8 10*3/uL (ref 1.7–7.7)
Neutrophils Relative %: 52 %
Platelets: 258 10*3/uL (ref 150–400)
RBC: 4.65 MIL/uL (ref 3.87–5.11)
RDW: 13.5 % (ref 11.5–15.5)
WBC: 7.2 10*3/uL (ref 4.0–10.5)
nRBC: 0 % (ref 0.0–0.2)

## 2023-01-31 LAB — COMPREHENSIVE METABOLIC PANEL
ALT: 36 U/L (ref 0–44)
AST: 38 U/L (ref 15–41)
Albumin: 3.6 g/dL (ref 3.5–5.0)
Alkaline Phosphatase: 72 U/L (ref 38–126)
Anion gap: 5 (ref 5–15)
BUN: 12 mg/dL (ref 8–23)
CO2: 28 mmol/L (ref 22–32)
Calcium: 9.4 mg/dL (ref 8.9–10.3)
Chloride: 104 mmol/L (ref 98–111)
Creatinine, Ser: 0.63 mg/dL (ref 0.44–1.00)
GFR, Estimated: >60 mL/min (ref >60)
Glucose, Bld: 103 mg/dL — ABNORMAL HIGH (ref 70–99)
Potassium: 4.1 mmol/L (ref 3.5–5.1)
Sodium: 137 mmol/L (ref 135–145)
Total Bilirubin: 0.6 mg/dL (ref 0.3–1.2)
Total Protein: 7 g/dL (ref 6.5–8.1)

## 2023-01-31 LAB — BRAIN NATRIURETIC PEPTIDE: B Natriuretic Peptide: 92 pg/mL (ref 0.0–100.0)

## 2023-01-31 LAB — TROPONIN I (HIGH SENSITIVITY)
Troponin I (High Sensitivity): 5 ng/L (ref <18)
Troponin I (High Sensitivity): 5 ng/L (ref <18)

## 2023-01-31 MED ORDER — IOHEXOL 350 MG/ML SOLN
75.0000 mL | Freq: Once | INTRAVENOUS | Status: AC | PRN
  Administered 2023-01-31: 75 mL via INTRAVENOUS

## 2023-01-31 MED ORDER — PREDNISONE 10 MG PO TABS
40.0000 mg | ORAL_TABLET | Freq: Every day | ORAL | 0 refills | Status: DC
Start: 2023-01-31 — End: 2023-01-31

## 2023-01-31 MED ORDER — BENZONATATE 100 MG PO CAPS: 100.0000 mg | ORAL_CAPSULE | Freq: Three times a day (TID) | ORAL | 0 refills | Status: DC

## 2023-01-31 MED ORDER — PREDNISONE 10 MG PO TABS
40.0000 mg | ORAL_TABLET | Freq: Every day | ORAL | 0 refills | Status: DC
  Filled 2023-01-31: qty 16, 4d supply, fill #0

## 2023-01-31 MED ORDER — IPRATROPIUM-ALBUTEROL 0.5-2.5 (3) MG/3ML IN SOLN
3.0000 mL | Freq: Once | RESPIRATORY_TRACT | Status: AC
  Administered 2023-01-31: 3 mL via RESPIRATORY_TRACT
  Filled 2023-01-31: qty 3

## 2023-01-31 MED ORDER — ALBUTEROL SULFATE HFA 108 (90 BASE) MCG/ACT IN AERS
2.0000 | INHALATION_SPRAY | Freq: Once | RESPIRATORY_TRACT | Status: AC
  Administered 2023-01-31: 2 via RESPIRATORY_TRACT
  Filled 2023-01-31: qty 6.7

## 2023-01-31 MED ORDER — BENZONATATE 100 MG PO CAPS
100.0000 mg | ORAL_CAPSULE | Freq: Three times a day (TID) | ORAL | 0 refills | Status: DC
  Filled 2023-01-31: qty 21, 7d supply, fill #0

## 2023-01-31 MED ORDER — BENZONATATE 100 MG PO CAPS
100.0000 mg | ORAL_CAPSULE | Freq: Three times a day (TID) | ORAL | 0 refills | Status: AC
  Filled 2023-01-31: qty 21, 7d supply, fill #0

## 2023-01-31 MED ORDER — METHYLPREDNISOLONE SODIUM SUCC 125 MG IJ SOLR
125.0000 mg | Freq: Once | INTRAMUSCULAR | Status: AC
  Administered 2023-01-31: 125 mg via INTRAVENOUS
  Filled 2023-01-31: qty 2

## 2023-01-31 MED ORDER — PREDNISONE 10 MG PO TABS
40.0000 mg | ORAL_TABLET | Freq: Every day | ORAL | 0 refills | Status: AC
  Filled 2023-01-31: qty 16, 4d supply, fill #0

## 2023-01-31 NOTE — ED Provider Notes (Signed)
  [place] EMERGENCY DEPARTMENT AT [facility] Provider Note   CSN: [id number] Arrival date & time: 01/31/23  1308     History {Add pertinent medical, surgical, social history, OB history to HPI:1} Chief Complaint  Patient presents with   Cough    Paula Berger is a 72 y.o. female.  HPI     72yo female with history of prior smoking,   Coughing, worse at night, coughing so much. Severe cough. WHen coughing it hurts. Has been about one month, received treatment and pills.  Then noticed the cough, down deep.  Feels like steroids helped, using inhaler, do think it helped some.  Up all night coughing.  Not coughing up blood, is clear.  Nerve damage pain, no new pain, no swelling\.   No fever Diarrhea had it but improved No nausea or vomiting No runny nose Sometimes a little shortness of breath  Chest pain with cough Just quit smoking since last visit   Past Medical History:  Diagnosis Date   Bronchitis    Pneumonia     Home Medications Prior to Admission medications   Medication Sig Start Date End Date Taking? Authorizing Provider  azithromycin (ZITHROMAX) 250 MG tablet Take first 2 tablets together on day 1, then 1 tablet every day until finished. 05/04/21   Cheryll Cockayne, MD  benzonatate (TESSALON) 100 MG capsule Take 1 capsule (100 mg total) by mouth every 8 (eight) hours. 01/11/23   Arabella Merles, PA-C      Allergies    Patient has no known allergies.    Review of Systems   Review of Systems  Physical Exam Updated Vital Signs BP (!) 171/92   Pulse 68   Temp 97.6 F (36.4 C) (Oral)   Resp 18   Ht 5\' 4"  (1.626 m)   Wt 85.7 kg   SpO2 99%   BMI 32.43 kg/m  Physical Exam  ED Results / Procedures / Treatments   Labs (all labs ordered are listed, but only abnormal results are displayed) Labs Reviewed - No data to display  EKG None  Radiology No results found.  Procedures Procedures  {Document cardiac monitor, telemetry assessment  procedure when appropriate:1}  Medications Ordered in ED Medications - No data to display  ED Course/ Medical Decision Making/ A&P   {   Click here for ABCD2, HEART and other calculatorsREFRESH Note before signing :1}                              Medical Decision Making  ***  {Document critical care time when appropriate:1} {Document review of labs and clinical decision tools ie heart score, Chads2Vasc2 etc:1}  {Document your independent review of radiology images, and any outside records:1} {Document your discussion with family members, caretakers, and with consultants:1} {Document social determinants of health affecting pt's care:1} {Document your decision making why or why not admission, treatments were needed:1} Final Clinical Impression(s) / ED Diagnoses Final diagnoses:  None    Rx / DC Orders ED Discharge Orders     None

## 2023-01-31 NOTE — ED Notes (Signed)
Patient verbalizes understanding of discharge instructions. Opportunity for questioning and answers were provided. Patient discharged from ED. IV x1 removed

## 2023-01-31 NOTE — ED Triage Notes (Signed)
Was seen 20 days ago here and was given prednisone.  States back still having cough.  Productive white.  Resp clear NAD

## 2023-03-09 IMAGING — DX DG CHEST 1V
1 series · 1 of 1 positions shown · non-contrast
Comparison: April 04, 2009 and September 13, 2020.

CLINICAL DATA: A 71-year-old female presents for evaluation of
cough for 3 days also with apparent shortness of breath.

EXAM:
CHEST  1 VIEW

[chest]
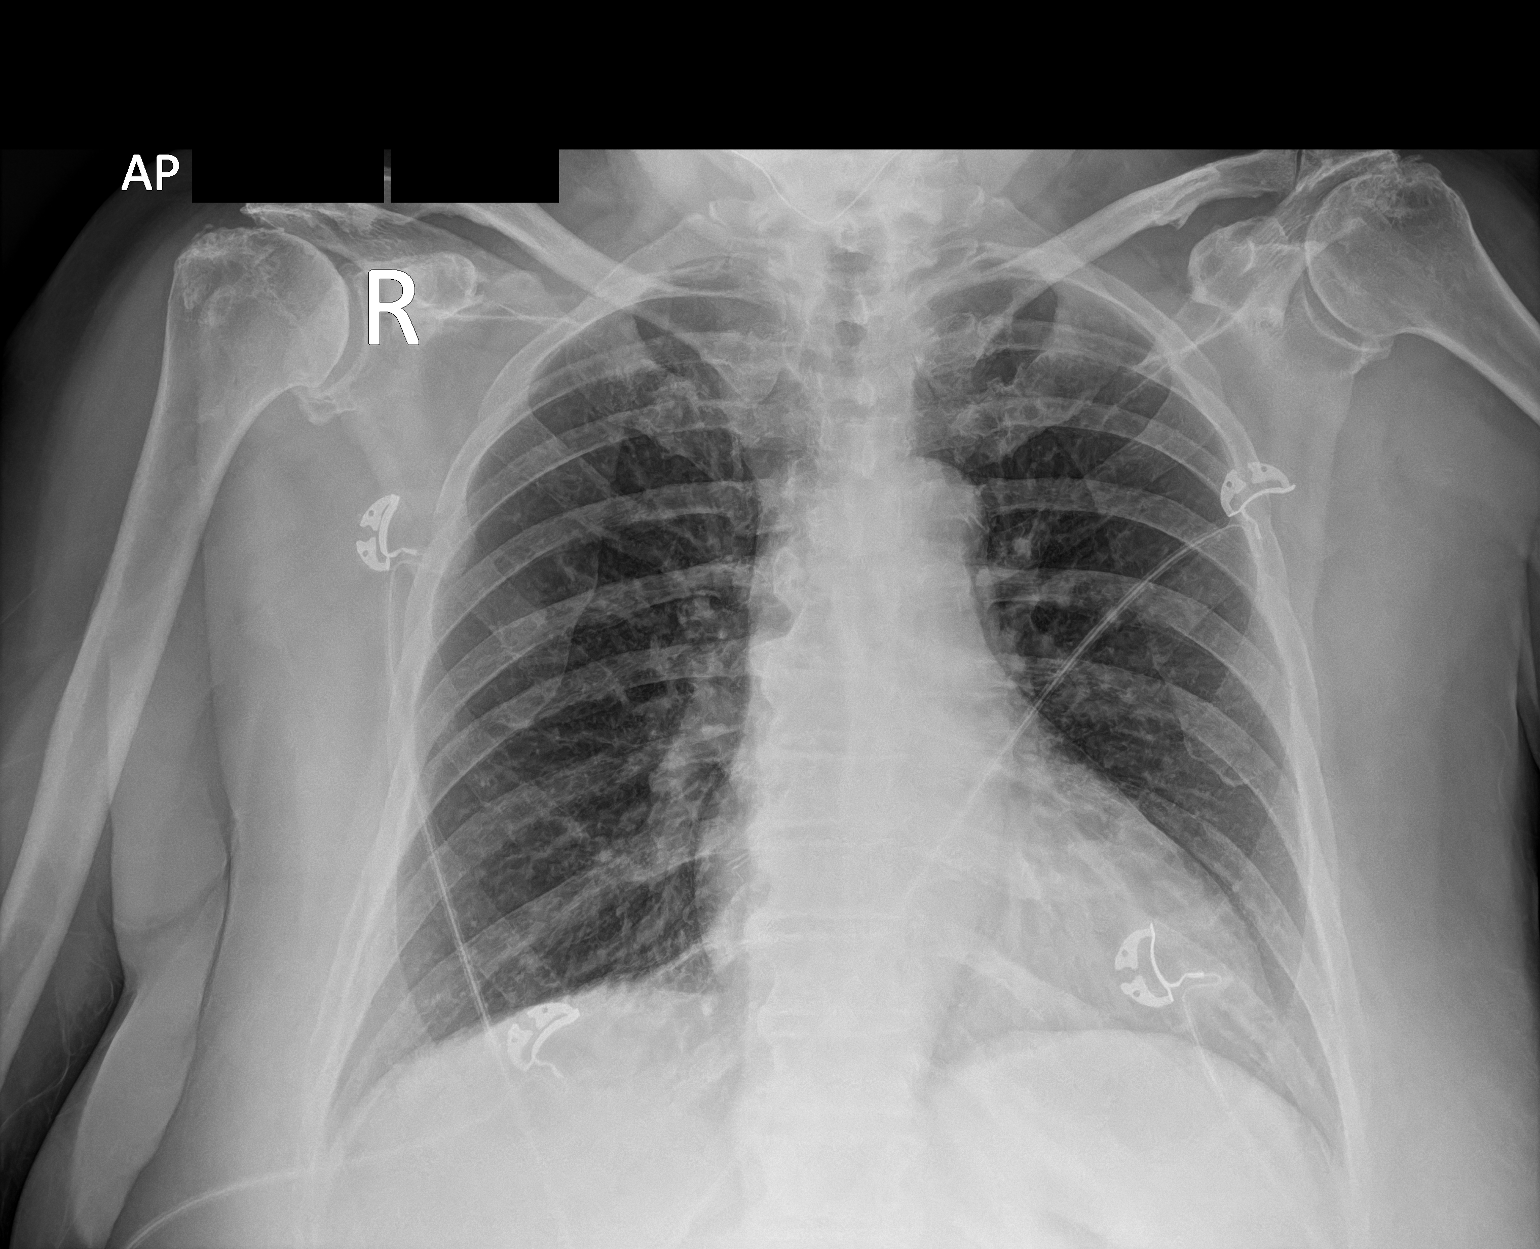

[1 of 1 positions shown; findings below may reference images not displayed]

FINDINGS: EKG leads project over the chest.

Cardiomediastinal contours and hilar structures are stable and
normal. Lungs are clear. No visible pneumothorax. On limited
assessment there is no acute skeletal process.
IMPRESSION: No acute cardiopulmonary disease.
